# Patient Record
Sex: Female | Born: 1970 | Hispanic: No | Marital: Single
Health system: Southern US, Community
[De-identification: ages and names within clinical notes are randomized; demographics above are authoritative.]

---

## 1999-02-05 ENCOUNTER — Other Ambulatory Visit: Admission: RE | Admit: 1999-02-05 | Discharge: 1999-02-05 | Payer: Self-pay | Admitting: Obstetrics and Gynecology

## 2000-03-11 ENCOUNTER — Other Ambulatory Visit: Admission: RE | Admit: 2000-03-11 | Discharge: 2000-03-11 | Payer: Self-pay | Admitting: Obstetrics and Gynecology

## 2001-03-18 ENCOUNTER — Other Ambulatory Visit: Admission: RE | Admit: 2001-03-18 | Discharge: 2001-03-18 | Payer: Self-pay | Admitting: Obstetrics and Gynecology

## 2001-08-28 ENCOUNTER — Ambulatory Visit (HOSPITAL_BASED_OUTPATIENT_CLINIC_OR_DEPARTMENT_OTHER): Admission: RE | Admit: 2001-08-28 | Discharge: 2001-08-28 | Payer: Self-pay | Admitting: Orthopedic Surgery

## 2001-08-28 ENCOUNTER — Encounter (INDEPENDENT_AMBULATORY_CARE_PROVIDER_SITE_OTHER): Payer: Self-pay | Admitting: Specialist

## 2002-03-22 ENCOUNTER — Other Ambulatory Visit: Admission: RE | Admit: 2002-03-22 | Discharge: 2002-03-22 | Payer: Self-pay | Admitting: Obstetrics and Gynecology

## 2003-04-14 ENCOUNTER — Other Ambulatory Visit: Admission: RE | Admit: 2003-04-14 | Discharge: 2003-04-14 | Payer: Self-pay | Admitting: Obstetrics and Gynecology

## 2004-06-04 ENCOUNTER — Other Ambulatory Visit: Admission: RE | Admit: 2004-06-04 | Discharge: 2004-06-04 | Payer: Self-pay | Admitting: Obstetrics and Gynecology

## 2005-07-05 ENCOUNTER — Other Ambulatory Visit: Admission: RE | Admit: 2005-07-05 | Discharge: 2005-07-05 | Payer: Self-pay | Admitting: Obstetrics and Gynecology

## 2011-04-26 NOTE — Op Note (Signed)
. [facility]  Patient:    AJANE, NOVELLA Visit Number: [id number] MRN: [id number]          Service Type: DSU Location: [facility] Attending Physician:  Susa Day Dictated by:   Katy Fitch Naaman Plummer., M.D. Proc. Date: 08/28/01 Admit Date:  08/28/2001   CC:         Katy Fitch. Sypher, Montez Hageman., M.D. 2 copies   Operative Report  PREOPERATIVE DIAGNOSIS:  Enlarging mass, dorsal aspect of right wrist, consistent with capsular ganglion.  POSTOPERATIVE DIAGNOSIS:  Complex infiltrating capsular ganglion/ ______, dorsal aspect of right wrist capsule.  OPERATION:  Subtotal dorsal capsulectomy of right wrist for extensive infiltrating ______ change consistent with ganglion/ ______.  SURGEON:  Katy Fitch. Sypher, Montez Hageman., M.D.  ASSISTANT:  Molly Maduro ______ P.A.C.  ANESTHESIA:  IV regional at proximal forearm level  SUPERVISING ANESTHESIOLOGIST:  Burna Forts, M.D.  INDICATIONS FOR PROCEDURE:  Maria Pitts is a 40 year old woman referred by Dr. Merri Brunette for evaluation and management of a painful mass on the dorsal aspect of her right wrist.  On examination in the office she was noted to have what appeared to be a ganglion.  We recommended exploration, anticipating excision of a ganglion and/or capsulectomy.  Preoperatively, she was advised that ganglions can recur, and at times are difficult to eradicate due to their infiltrating nature.  Questions were invited and answered.  DESCRIPTION OF PROCEDURE:  [name] was brought to the operating room and placed in supine position upon the operating table.  Following placement of a proximal forearm level IV regional block, the right arm was prepped with Betadine solution and sterilely draped.  When satisfactory anesthesia was confirmed, the procedure commenced with a short transverse incision directly over the mass.  Subcutaneous tissues were carefully divided revealing a complex ganglion  that was exiting distal to the extensor retinaculum.  This was circumferentially dissected and found to have multiple chambers, at least four, that were infiltrating into the fourth dorsal compartment over the distal radius, one over the radial aspect of the dorsal scaphoid, one distally towards the scaphoid capitate joint, and a central portion directly over the lunate.  These were circumferentially dissected, and ultimately found to be intimately involved with the dorsal carpal arch.  Due to the ______ nature of this lesion I proceed with a subtotal dorsal capsulectomy exposing the proximal half of the scaphoid and entire scaphoid lunate interosseous ligament region.  The mass was passed off and found to be essentially a bubbly infiltrated capsule segment.  The posterior interosseous nerve and vessel were ultimately sacrificed with this, in that the ganglion diffusely involved the capsule surrounding these structures.  Care was taken to isolate the posterior interosseous nerve and transect it with a sharp scalpel blade, and the vessel was electrocauterized independent of the nerve.  I attempted to rotate tissues dorsally to obtain a capsular closure, however, this would have limited palmar flexion of the wrist.  Therefore, I have elected to allow this to heal by secondary intention.  The wrist joint was thoroughly irrigated with sterile saline followed by the use of a rongeur to meticulously clean the dorsal surface of the scapholunate interosseous ligament.  Hemostasis was achieved on the dorsal carpal arch with bipolar cautery under direct vision.  The wound was then repaired with intradermal 3-0 Prolene.  xeroform was applied directly to the incision, followed by a gauze bolster to close dead space, and a voluminous gauze dressing with a volar  splint maintained in 5 degrees dorsiflexion.  There were no apparent complications.  Ms. Louissaint tolerated the surgery and anesthesia  well.  She was transferred to the recovery room with stable vital signs.  For aftercare she is given a prescription for Percocet 5 mg one or two tablets p.o. q.4-6h. p.r.n. pain, also Keflex 500 mg one p.o. q.8h. x 4 days as a prophylactic antibiotic given the fact that this was an intra-articular procedure. Dictated by:   Katy Fitch Naaman Plummer., M.D. Attending Physician:  Susa Day DD:  08/28/01 TD:  08/28/01 Job: [id number]

## 2013-06-02 ENCOUNTER — Other Ambulatory Visit: Payer: Self-pay | Admitting: Obstetrics and Gynecology

## 2013-06-02 DIAGNOSIS — R928 Other abnormal and inconclusive findings on diagnostic imaging of breast: Secondary | ICD-10-CM

## 2013-06-09 ENCOUNTER — Other Ambulatory Visit: Payer: Self-pay | Admitting: Obstetrics and Gynecology

## 2013-06-09 DIAGNOSIS — R922 Inconclusive mammogram: Secondary | ICD-10-CM

## 2013-06-15 ENCOUNTER — Ambulatory Visit
Admission: RE | Admit: 2013-06-15 | Discharge: 2013-06-15 | Disposition: A | Discharge status: Home / Self Care | Payer: BC Managed Care – PPO | Source: Ambulatory Visit | Attending: Obstetrics and Gynecology | Admitting: Obstetrics and Gynecology

## 2013-06-15 DIAGNOSIS — R928 Other abnormal and inconclusive findings on diagnostic imaging of breast: Secondary | ICD-10-CM

## 2014-05-31 ENCOUNTER — Other Ambulatory Visit: Payer: Self-pay | Admitting: Obstetrics and Gynecology

## 2014-05-31 DIAGNOSIS — R2232 Localized swelling, mass and lump, left upper limb: Secondary | ICD-10-CM

## 2014-06-06 ENCOUNTER — Ambulatory Visit
Admission: RE | Admit: 2014-06-06 | Discharge: 2014-06-06 | Disposition: A | Discharge status: Home / Self Care | Payer: BC Managed Care – PPO | Source: Ambulatory Visit | Attending: Obstetrics and Gynecology | Admitting: Obstetrics and Gynecology

## 2014-06-06 DIAGNOSIS — R2232 Localized swelling, mass and lump, left upper limb: Secondary | ICD-10-CM

## 2018-04-28 DIAGNOSIS — Z87442 Personal history of urinary calculi: Secondary | ICD-10-CM | POA: Insufficient documentation

## 2018-09-28 ENCOUNTER — Other Ambulatory Visit: Payer: Self-pay | Admitting: Obstetrics and Gynecology

## 2018-09-28 DIAGNOSIS — R928 Other abnormal and inconclusive findings on diagnostic imaging of breast: Secondary | ICD-10-CM

## 2018-10-02 ENCOUNTER — Ambulatory Visit
Admission: RE | Admit: 2018-10-02 | Discharge: 2018-10-02 | Disposition: A | Discharge status: Home / Self Care | Payer: BLUE CROSS/BLUE SHIELD | Source: Ambulatory Visit | Attending: Obstetrics and Gynecology | Admitting: Obstetrics and Gynecology

## 2018-10-02 DIAGNOSIS — R928 Other abnormal and inconclusive findings on diagnostic imaging of breast: Secondary | ICD-10-CM

## 2019-10-18 ENCOUNTER — Other Ambulatory Visit: Payer: Self-pay | Admitting: Obstetrics and Gynecology

## 2019-10-18 DIAGNOSIS — R928 Other abnormal and inconclusive findings on diagnostic imaging of breast: Secondary | ICD-10-CM

## 2019-10-19 ENCOUNTER — Other Ambulatory Visit: Payer: BLUE CROSS/BLUE SHIELD

## 2019-10-20 ENCOUNTER — Ambulatory Visit
Admission: RE | Admit: 2019-10-20 | Discharge: 2019-10-20 | Disposition: A | Discharge status: Home / Self Care | Payer: BC Managed Care – PPO | Source: Ambulatory Visit | Attending: Obstetrics and Gynecology | Admitting: Obstetrics and Gynecology

## 2019-10-20 ENCOUNTER — Other Ambulatory Visit: Payer: Self-pay

## 2019-10-20 ENCOUNTER — Other Ambulatory Visit: Payer: Self-pay | Admitting: Obstetrics and Gynecology

## 2019-10-20 DIAGNOSIS — R928 Other abnormal and inconclusive findings on diagnostic imaging of breast: Secondary | ICD-10-CM

## 2019-10-20 DIAGNOSIS — N6489 Other specified disorders of breast: Secondary | ICD-10-CM

## 2019-10-26 ENCOUNTER — Ambulatory Visit
Admission: RE | Admit: 2019-10-26 | Discharge: 2019-10-26 | Disposition: A | Discharge status: Home / Self Care | Payer: BC Managed Care – PPO | Source: Ambulatory Visit | Attending: Obstetrics and Gynecology | Admitting: Obstetrics and Gynecology

## 2019-10-26 ENCOUNTER — Other Ambulatory Visit: Payer: Self-pay

## 2019-10-26 ENCOUNTER — Other Ambulatory Visit: Payer: Self-pay | Admitting: Obstetrics and Gynecology

## 2019-10-26 DIAGNOSIS — N6489 Other specified disorders of breast: Secondary | ICD-10-CM

## 2019-10-29 ENCOUNTER — Other Ambulatory Visit: Payer: Self-pay | Admitting: Obstetrics and Gynecology

## 2019-10-29 DIAGNOSIS — N6310 Unspecified lump in the right breast, unspecified quadrant: Secondary | ICD-10-CM

## 2020-04-19 ENCOUNTER — Other Ambulatory Visit: Payer: Self-pay

## 2020-04-19 ENCOUNTER — Ambulatory Visit
Admission: RE | Admit: 2020-04-19 | Discharge: 2020-04-19 | Disposition: A | Discharge status: Home / Self Care | Payer: BC Managed Care – PPO | Source: Ambulatory Visit | Attending: Obstetrics and Gynecology | Admitting: Obstetrics and Gynecology

## 2020-04-19 DIAGNOSIS — N6310 Unspecified lump in the right breast, unspecified quadrant: Secondary | ICD-10-CM

## 2020-12-13 ENCOUNTER — Other Ambulatory Visit: Payer: Self-pay | Admitting: Obstetrics and Gynecology

## 2020-12-13 DIAGNOSIS — R928 Other abnormal and inconclusive findings on diagnostic imaging of breast: Secondary | ICD-10-CM

## 2020-12-28 ENCOUNTER — Ambulatory Visit
Admission: RE | Admit: 2020-12-28 | Discharge: 2020-12-28 | Disposition: A | Discharge status: Home / Self Care | Payer: BC Managed Care – PPO | Source: Ambulatory Visit | Attending: Obstetrics and Gynecology | Admitting: Obstetrics and Gynecology

## 2020-12-28 ENCOUNTER — Other Ambulatory Visit: Payer: Self-pay | Admitting: Obstetrics and Gynecology

## 2020-12-28 ENCOUNTER — Other Ambulatory Visit: Payer: Self-pay

## 2020-12-28 DIAGNOSIS — R928 Other abnormal and inconclusive findings on diagnostic imaging of breast: Secondary | ICD-10-CM

## 2021-01-03 ENCOUNTER — Other Ambulatory Visit: Payer: BC Managed Care – PPO

## 2021-06-21 ENCOUNTER — Ambulatory Visit
Admission: RE | Admit: 2021-06-21 | Discharge: 2021-06-21 | Disposition: A | Discharge status: Home / Self Care | Payer: BC Managed Care – PPO | Source: Ambulatory Visit | Attending: Obstetrics and Gynecology | Admitting: Obstetrics and Gynecology

## 2021-06-21 ENCOUNTER — Other Ambulatory Visit: Payer: Self-pay | Admitting: Obstetrics and Gynecology

## 2021-06-21 ENCOUNTER — Other Ambulatory Visit: Payer: Self-pay

## 2021-06-21 DIAGNOSIS — R928 Other abnormal and inconclusive findings on diagnostic imaging of breast: Secondary | ICD-10-CM

## 2021-06-28 ENCOUNTER — Other Ambulatory Visit: Payer: BC Managed Care – PPO

## 2021-12-25 ENCOUNTER — Other Ambulatory Visit: Payer: BC Managed Care – PPO

## 2022-02-18 ENCOUNTER — Other Ambulatory Visit: Payer: Self-pay

## 2022-02-18 ENCOUNTER — Ambulatory Visit
Admission: RE | Admit: 2022-02-18 | Discharge: 2022-02-18 | Disposition: A | Discharge status: Home / Self Care | Payer: Self-pay | Source: Ambulatory Visit | Attending: Obstetrics and Gynecology | Admitting: Obstetrics and Gynecology

## 2022-02-18 ENCOUNTER — Ambulatory Visit
Admission: RE | Admit: 2022-02-18 | Discharge: 2022-02-18 | Disposition: A | Discharge status: Home / Self Care | Payer: BC Managed Care – PPO | Source: Ambulatory Visit | Attending: Obstetrics and Gynecology | Admitting: Obstetrics and Gynecology

## 2022-02-18 DIAGNOSIS — R928 Other abnormal and inconclusive findings on diagnostic imaging of breast: Secondary | ICD-10-CM

## 2022-07-16 IMAGING — MG DIGITAL DIAGNOSTIC BILAT W/ TOMO W/ CAD
8 series · 8 of 24 positions shown · non-contrast
Comparison: Previous exam(s).

CLINICAL DATA: Short-term follow-up for probably benign right
breast asymmetries correlate ultrasound lesions. These were
initially assessed with diagnostic imaging on 12/28/2020. Patient
has had a previous benign right breast needle biopsy.

EXAM:
DIGITAL DIAGNOSTIC BILATERAL MAMMOGRAM WITH TOMOSYNTHESIS AND CAD;
ULTRASOUND RIGHT BREAST LIMITED
TECHNIQUE: Bilateral digital diagnostic mammography and breast tomosynthesis
was performed. The images were evaluated with computer-aided
detection.; Targeted ultrasound examination of the right breast was
performed

[L CC synth-2D]
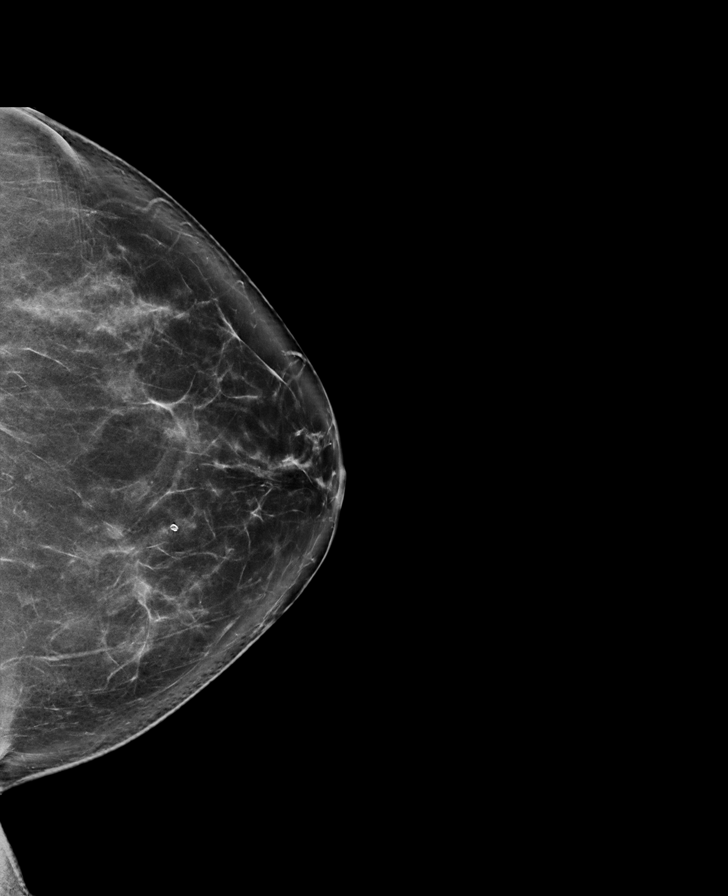

[R MLO synth-2D]
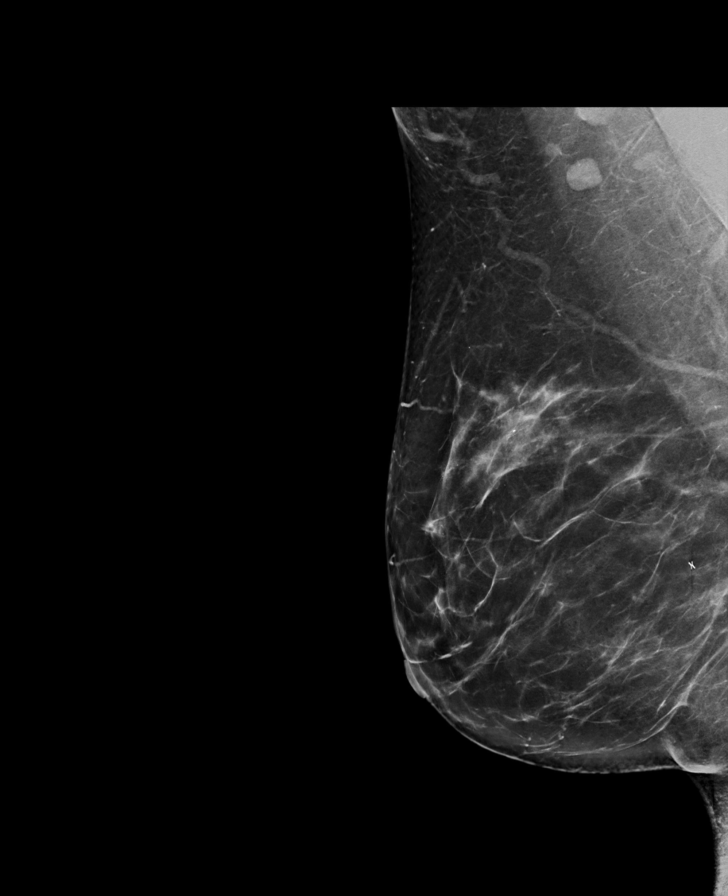

[L MLO synth-2D]
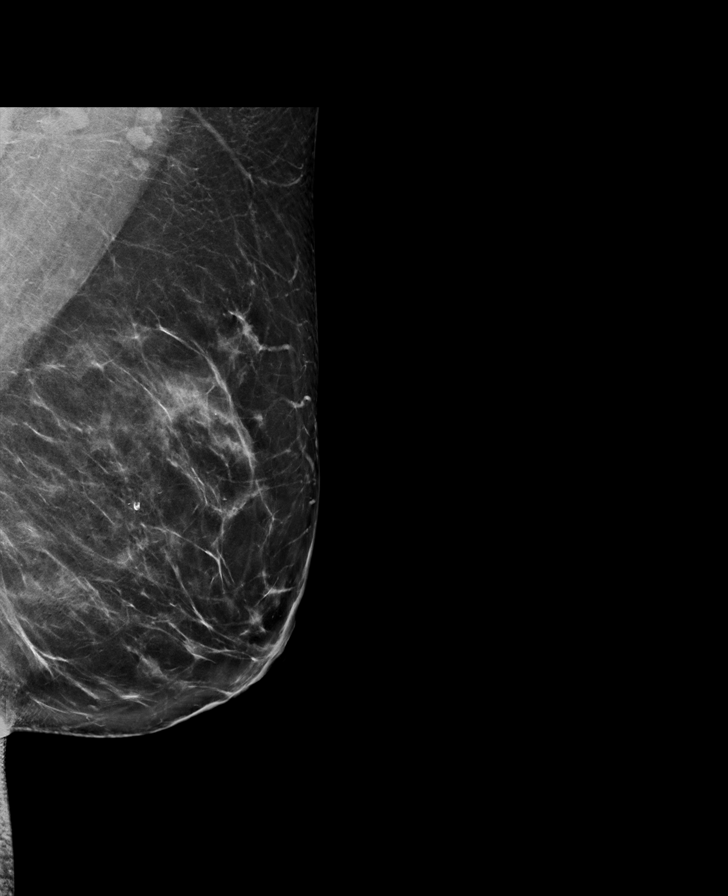

[R CC synth-2D]
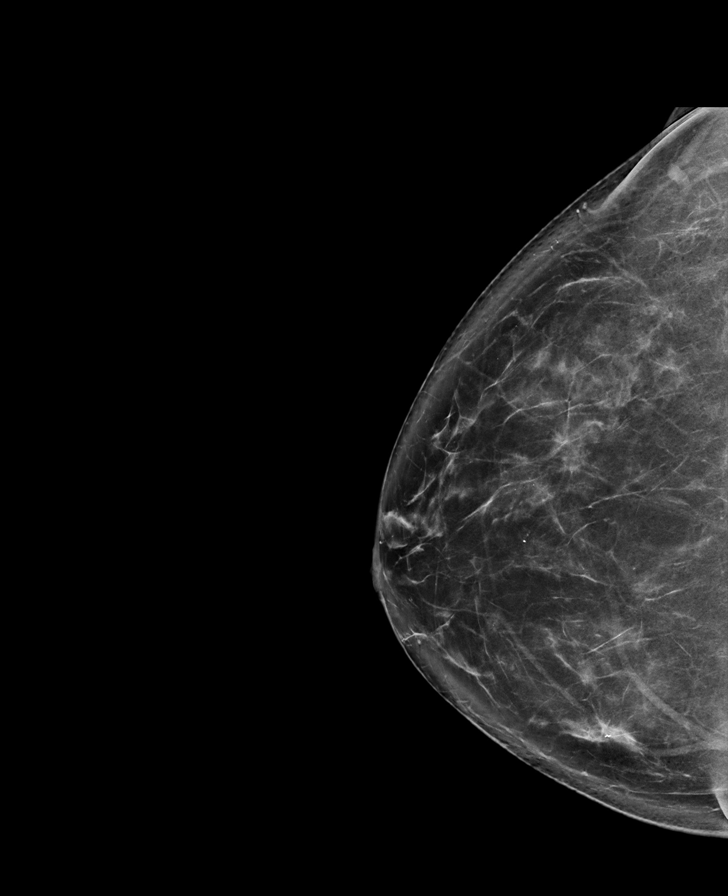

[L MLO tomo · tomo slice 45/90.0]
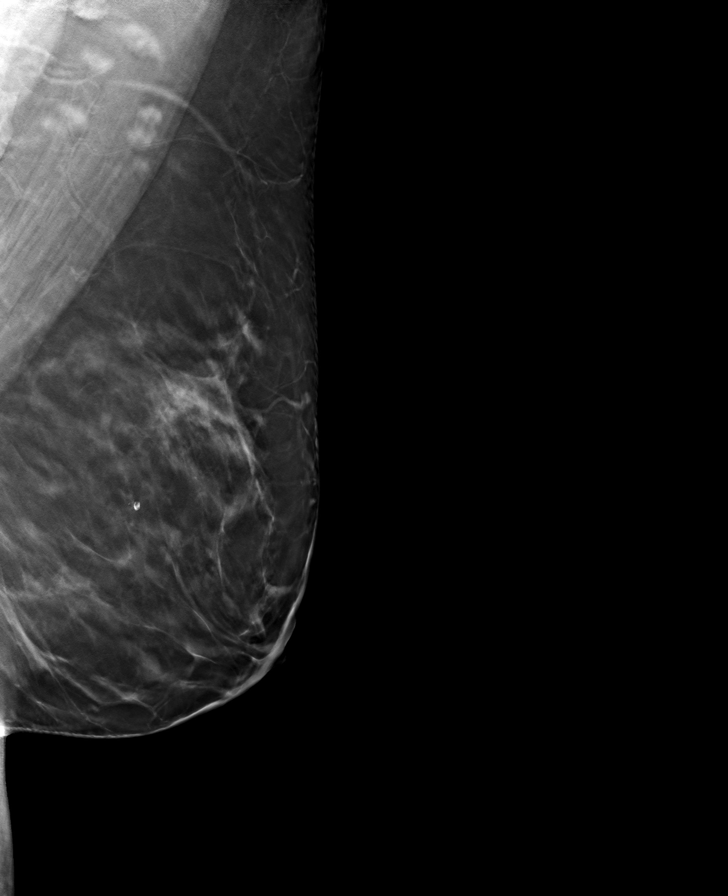

[L CC tomo · tomo slice 43/86.0]
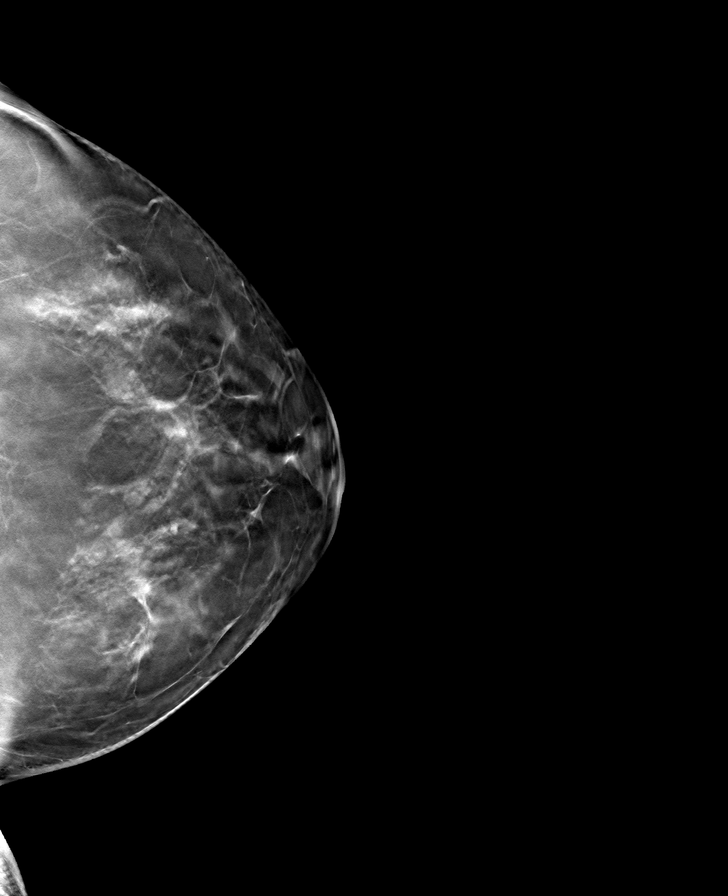

[R CC tomo · tomo slice 42/83.0]
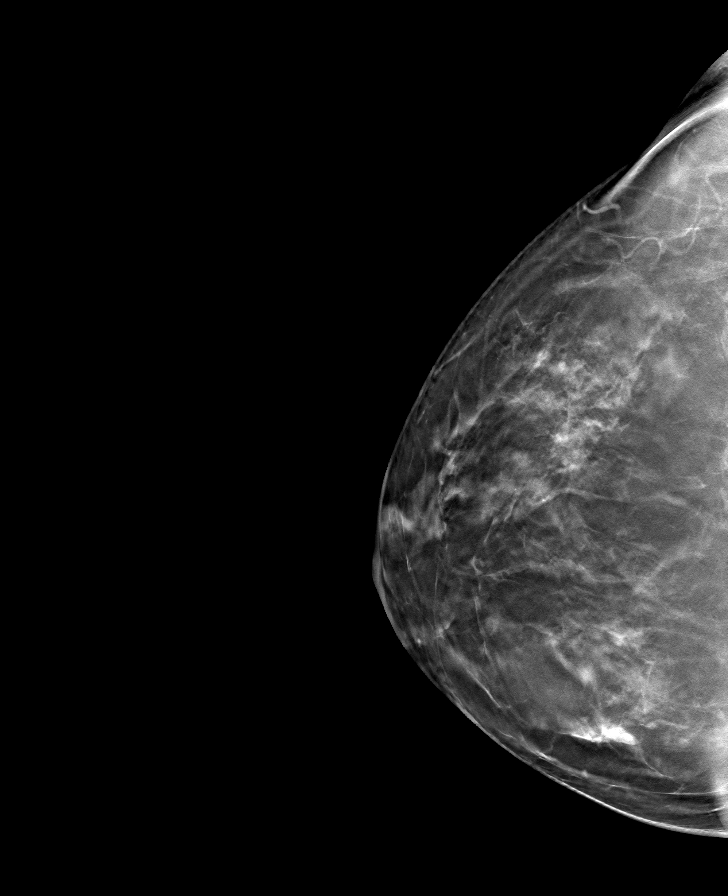

[R MLO tomo · tomo slice 47/93.0]
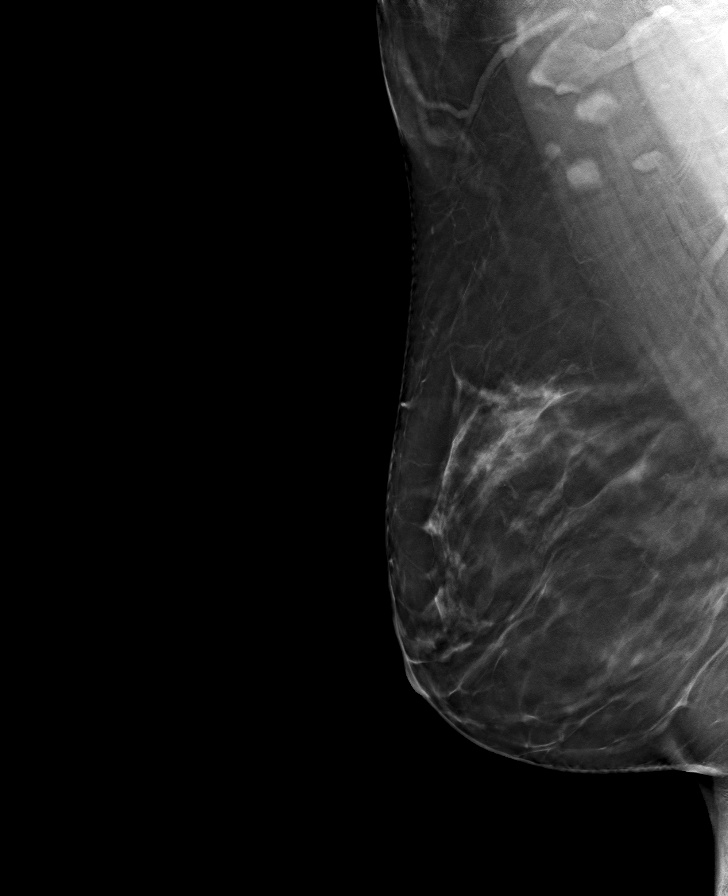

[8 of 24 positions shown; findings below may reference images not displayed]

ACR Breast Density Category b: There are scattered areas of
fibroglandular density.
FINDINGS: The area of asymmetry in the posterior, medial right breast is less
apparent. The cyst in the left breast is significantly smaller than
it was on the screening study from 12/11/2020. There are no new or
suspicious masses, no areas of architectural distortion and no new
or suspicious areas of asymmetry. There are no suspicious
calcifications.

Targeted right breast ultrasound is performed, showing a tiny cystic
lesion at 1 o'clock, 5 cm the nipple, measuring 2 mm in greatest
dimension, previously 4 mm. Adjacent to this is a cystic appearing
lesion, also 1 o'clock, 5 cm the nipple, measuring 5 x 3 x 4 mm,
decreased from 7 x 3 x 5 mm.
IMPRESSION: 1. No evidence of breast malignancy.
2. Benign area of medial right breast asymmetry, less apparent than
it was prior exams, which may correlate with the benign small cystic
lesions noted sonographically in the right breast at 1 o'clock 5 cm
the nipple, which have decreased in size from the prior exams.

RECOMMENDATION:
Screening mammogram in one year.(Code:3G-T-DM5)

I have discussed the findings and recommendations with the patient.
If applicable, a reminder letter will be sent to the patient
regarding the next appointment.

BI-RADS CATEGORY  2: Benign.

## 2022-07-16 IMAGING — US US BREAST*R* LIMITED INC AXILLA
1 series · 10 of 10 positions shown · non-contrast
Comparison: Previous exam(s).

CLINICAL DATA: Short-term follow-up for probably benign right
breast asymmetries correlate ultrasound lesions. These were
initially assessed with diagnostic imaging on 12/28/2020. Patient
has had a previous benign right breast needle biopsy.

EXAM:
DIGITAL DIAGNOSTIC BILATERAL MAMMOGRAM WITH TOMOSYNTHESIS AND CAD;
ULTRASOUND RIGHT BREAST LIMITED
TECHNIQUE: Bilateral digital diagnostic mammography and breast tomosynthesis
was performed. The images were evaluated with computer-aided
detection.; Targeted ultrasound examination of the right breast was
performed

[Series 1: us breast*right* limited inc axilla · 0.07mm/px · 10 of 10 slices shown]
[im 1/10]
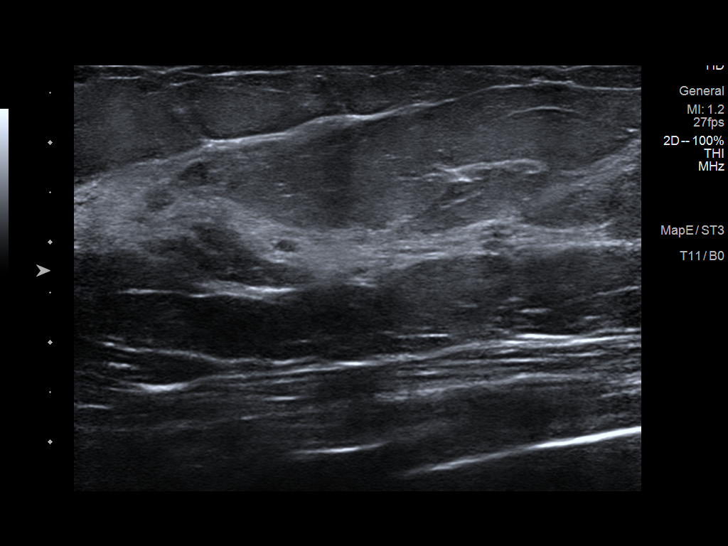
[im 2/10]
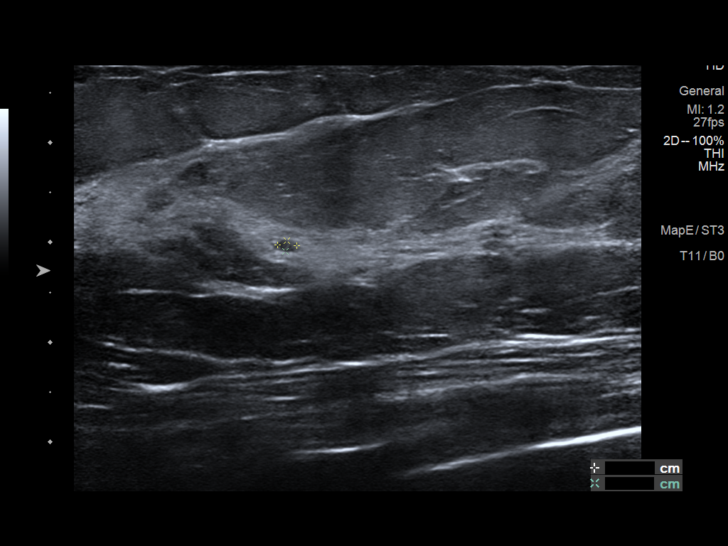
[im 3/10]
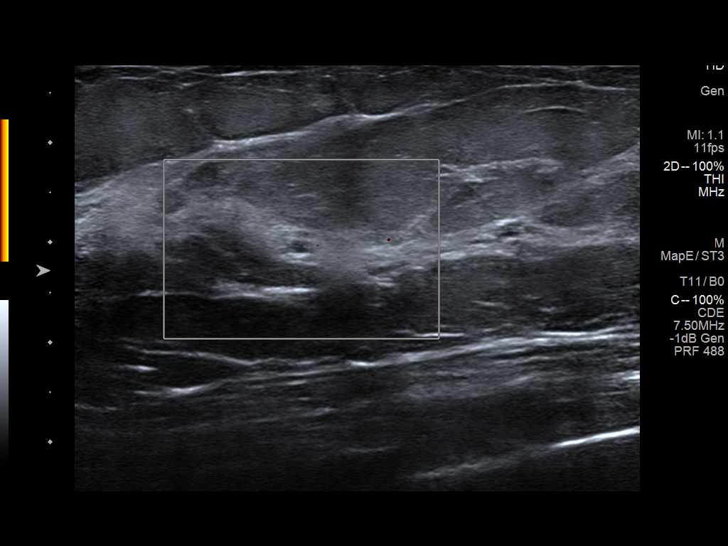
[im 4/10]
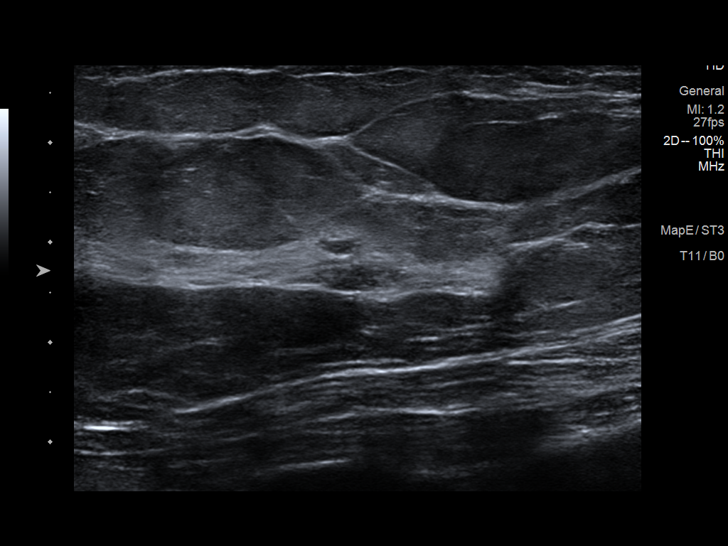
[im 5/10]
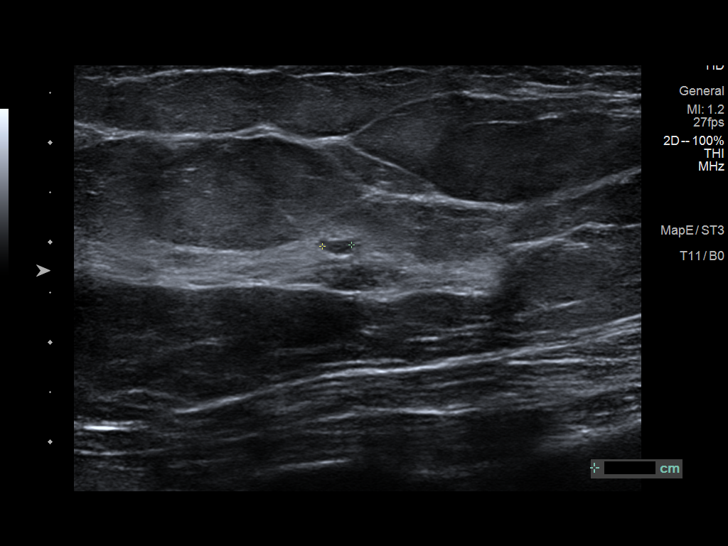
[im 6/10]
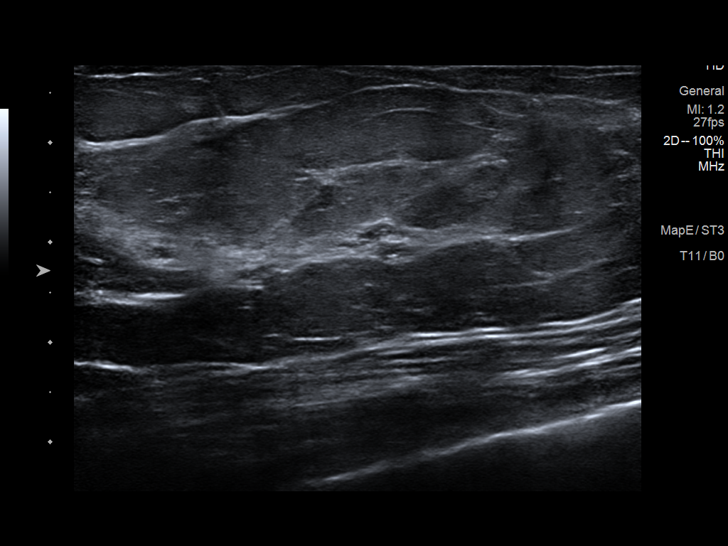
[im 7/10]
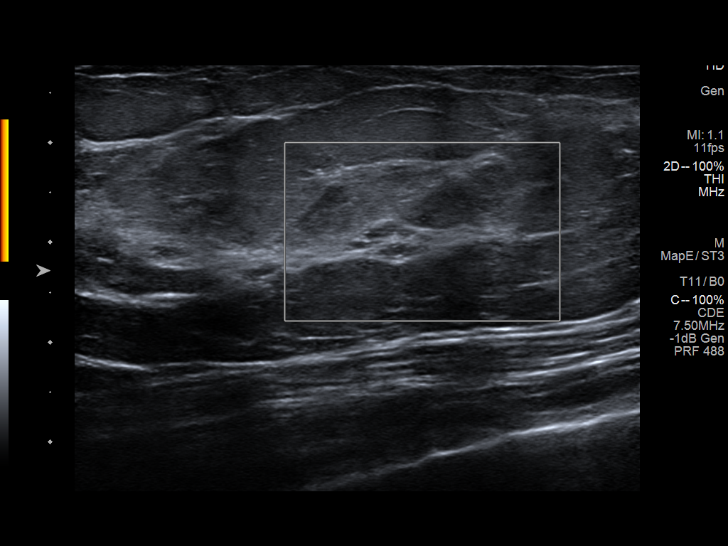
[im 8/10]
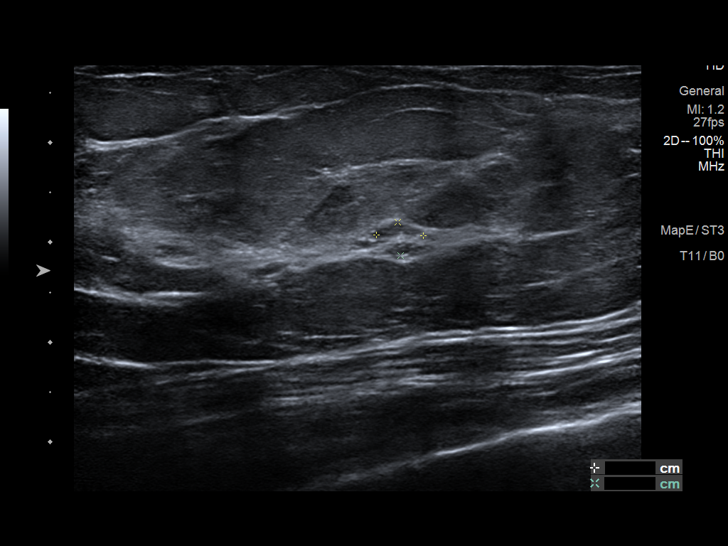
[im 9/10]
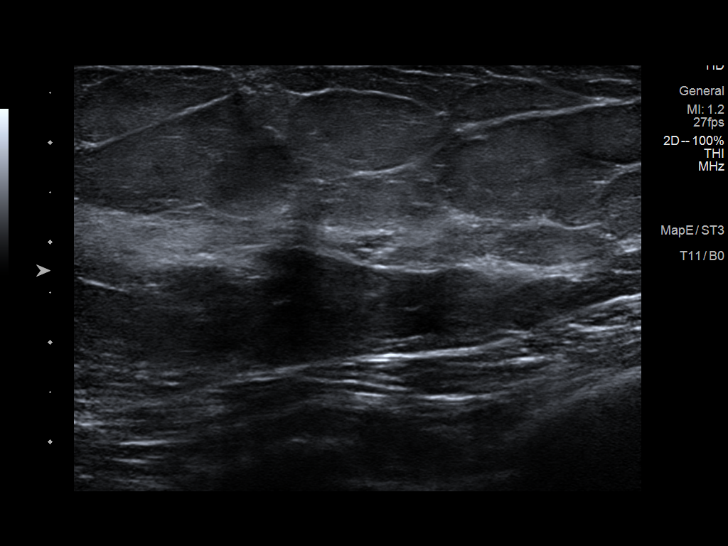
[im 10/10]
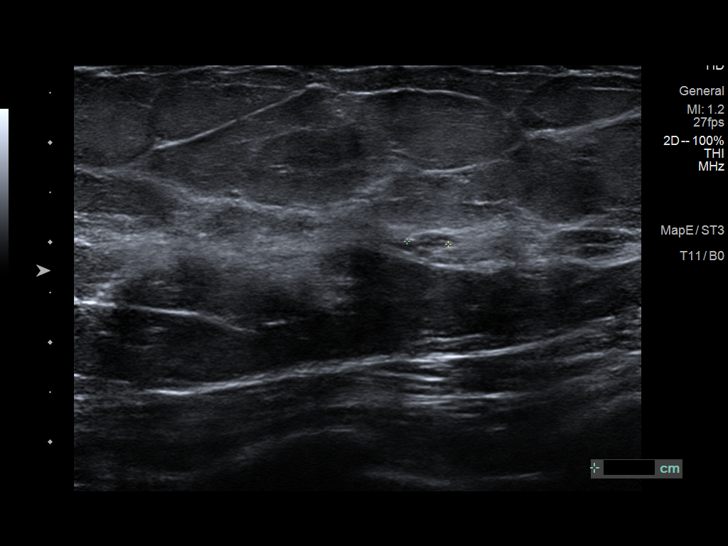

[10 of 10 positions shown; findings below may reference images not displayed]

ACR Breast Density Category b: There are scattered areas of
fibroglandular density.
FINDINGS: The area of asymmetry in the posterior, medial right breast is less
apparent. The cyst in the left breast is significantly smaller than
it was on the screening study from 12/11/2020. There are no new or
suspicious masses, no areas of architectural distortion and no new
or suspicious areas of asymmetry. There are no suspicious
calcifications.

Targeted right breast ultrasound is performed, showing a tiny cystic
lesion at 1 o'clock, 5 cm the nipple, measuring 2 mm in greatest
dimension, previously 4 mm. Adjacent to this is a cystic appearing
lesion, also 1 o'clock, 5 cm the nipple, measuring 5 x 3 x 4 mm,
decreased from 7 x 3 x 5 mm.
IMPRESSION: 1. No evidence of breast malignancy.
2. Benign area of medial right breast asymmetry, less apparent than
it was prior exams, which may correlate with the benign small cystic
lesions noted sonographically in the right breast at 1 o'clock 5 cm
the nipple, which have decreased in size from the prior exams.

RECOMMENDATION:
Screening mammogram in one year.(Code:3G-T-DM5)

I have discussed the findings and recommendations with the patient.
If applicable, a reminder letter will be sent to the patient
regarding the next appointment.

BI-RADS CATEGORY  2: Benign.

## 2024-09-23 ENCOUNTER — Ambulatory Visit (INDEPENDENT_AMBULATORY_CARE_PROVIDER_SITE_OTHER): Payer: Self-pay | Admitting: Podiatry

## 2024-09-23 ENCOUNTER — Ambulatory Visit (INDEPENDENT_AMBULATORY_CARE_PROVIDER_SITE_OTHER)

## 2024-09-23 DIAGNOSIS — M7661 Achilles tendinitis, right leg: Secondary | ICD-10-CM

## 2024-09-23 DIAGNOSIS — M722 Plantar fascial fibromatosis: Secondary | ICD-10-CM

## 2024-09-23 MED ORDER — MELOXICAM 15 MG PO TABS: 15.0000 mg | ORAL_TABLET | Freq: Every day | ORAL | 3 refills | Status: DC

## 2024-09-23 MED ORDER — METHYLPREDNISOLONE 4 MG PO TBPK: ORAL_TABLET | ORAL | 0 refills | Status: DC

## 2024-09-23 NOTE — Progress Notes (Signed)
  Subjective:  Patient ID: Maria Pitts, female    DOB: 1970-12-30,  MRN: [id number] HPI Chief Complaint  Patient presents with   Foot Pain    Ankle/heel pain - started 6 months ago after she was on her feet for 14 hours at a golf tournament. Tried soaks, ice, voltaren gel - achilles pain    53 y.o. female presents with the above complaint.   ROS: Denies fever chills nausea mobic muscle aches pains calf pain back pain chest pain shortness of breath.  No past medical history on file. No past surgical history on file.  Current Outpatient Medications:    meloxicam (MOBIC) 15 MG tablet, Take 1 tablet (15 mg total) by mouth daily., Disp: 30 tablet, Rfl: 3   methylPREDNISolone (MEDROL DOSEPAK) 4 MG TBPK tablet, 6 day dose pack - take as directed, Disp: 21 tablet, Rfl: 0  No Known Allergies Review of Systems Objective:  There were no vitals filed for this visit.  General: Well developed, nourished, in no acute distress, alert and oriented x3   Dermatological: Skin is warm, dry and supple bilateral. Nails x 10 are well maintained; remaining integument appears unremarkable at this time. There are no open sores, no preulcerative lesions, no rash or signs of infection present.  Vascular: Dorsalis Pedis artery and Posterior Tibial artery pedal pulses are 2/4 bilateral with immedate capillary fill time. Pedal hair growth present. No varicosities and no lower extremity edema present bilateral.   Neruologic: Grossly intact via light touch bilateral. Vibratory intact via tuning fork bilateral. Protective threshold with Semmes Wienstein monofilament intact to all pedal sites bilateral. Patellar and Achilles deep tendon reflexes 2+ bilateral. No Babinski or clonus noted bilateral.   Musculoskeletal: No gross boney pedal deformities bilateral. No pain, crepitus, or limitation noted with foot and ankle range of motion bilateral. Muscular strength 5/5 in all groups tested bilateral.  She has pain  and fluctuance on palpation of the Achilles tendon at the lateral margin of the right Achilles tendon feels a little bit irregular as opposed to the medial margin.  She has some fluid retention anterior to the Achilles tendon.  This is also seen on radiograph.   she has good plantarflexion against resistance as well as dorsiflexion.  The tendon itself is intact with tenderness from about mid substance of the tendon distally to its most distal fibers insertion of the calcaneus.  Gait: Unassisted, Nonantalgic.    Radiographs:  Radiographs taken today demonstrate an osseously mature foot radiographs lateral view rear foot demonstrates considerable edema in the soft tissues particularly in Kager's triangle and somewhat open obliteration of the Achilles tendon.  Margins are not crisp and clear and easy to visualize.  Minimal spurring is noted.  Assessment & Plan:   Assessment: Insertional Achilles tendinitis cannot rule out partial tearing of the Achilles tendon right.  Plan: Discussed etiology pathology conservative versus surgical therapies at this point I highly recommended wearing a cam boot ice therapy compression therapy elevation also starting her on a methylprednisolone to be followed by meloxicam.  Will do this for 1 month consistently trying hard to be compliant and we will follow-up at that time and discuss whether we need to consider MRI for possible surgical repair.  She has a cam boot at home that she will utilize for this.     Seyed Heffley T. [place], [place]

## 2024-10-28 ENCOUNTER — Ambulatory Visit: Admitting: Podiatry

## 2024-10-28 DIAGNOSIS — M7661 Achilles tendinitis, right leg: Secondary | ICD-10-CM

## 2024-11-01 NOTE — Progress Notes (Signed)
 She presents today for follow-up of her Achilles tendinitis states that is better I have been out of the boot for about 2 weeks now the meloxicam  really seems to help.  Objective: Vital signs are stable alert and oriented x 3.  No reproducible pain on palpation of the right Achilles tendon there is no swelling no erythema she has good strength against resistance dorsiflexion plantarflexion inversion and eversion.  Assessment: Well-healing Achilles tendinitis.  Plan: Recommended that she continue the meloxicam  for another month and I will follow-up with her as needed.

## 2024-12-13 ENCOUNTER — Other Ambulatory Visit: Payer: Self-pay | Admitting: Podiatry
